# Patient Record
Sex: Female | Born: 1960 | Race: Black or African American | Hispanic: No | Marital: Married
Health system: Southern US, Community
[De-identification: ages and names within clinical notes are randomized; demographics above are authoritative.]

---

## 2016-06-23 ENCOUNTER — Inpatient Hospital Stay
Admission: RE | Admit: 2016-06-23 | Discharge: 2016-08-05 | Disposition: E | Payer: Self-pay | Attending: Internal Medicine | Admitting: Internal Medicine

## 2016-06-23 ENCOUNTER — Other Ambulatory Visit (HOSPITAL_COMMUNITY): Payer: Self-pay

## 2016-06-23 DIAGNOSIS — J9690 Respiratory failure, unspecified, unspecified whether with hypoxia or hypercapnia: Secondary | ICD-10-CM

## 2016-06-23 DIAGNOSIS — Z931 Gastrostomy status: Secondary | ICD-10-CM

## 2016-06-23 DIAGNOSIS — Z992 Dependence on renal dialysis: Secondary | ICD-10-CM

## 2016-06-23 LAB — BLOOD GAS, ARTERIAL
Acid-base deficit: 1.7 mmol/L (ref 0.0–2.0)
Bicarbonate: 22.4 mmol/L (ref 20.0–28.0)
DRAWN BY: 290171
FIO2: 40
O2 Saturation: 98.7 %
PEEP: 5 cmH2O
PO2 ART: 145 mmHg — AB (ref 83.0–108.0)
Patient temperature: 98.6
Pressure support: 10 cmH2O
pCO2 arterial: 36.5 mmHg (ref 32.0–48.0)
pH, Arterial: 7.404 (ref 7.350–7.450)

## 2016-06-23 MED ORDER — IOPAMIDOL (ISOVUE-300) INJECTION 61%
INTRAVENOUS | Status: AC
  Administered 2016-06-23: 20 mL via GASTROSTOMY
  Filled 2016-06-23: qty 50

## 2016-06-24 ENCOUNTER — Other Ambulatory Visit (HOSPITAL_COMMUNITY): Payer: Self-pay

## 2016-06-24 LAB — CBC WITH DIFFERENTIAL/PLATELET
BASOS ABS: 0.1 10*3/uL (ref 0.0–0.1)
BASOS PCT: 0 %
EOS PCT: 2 %
Eosinophils Absolute: 0.2 10*3/uL (ref 0.0–0.7)
HCT: 22.6 % — ABNORMAL LOW (ref 36.0–46.0)
Hemoglobin: 7.4 g/dL — ABNORMAL LOW (ref 12.0–15.0)
Lymphocytes Relative: 11 %
Lymphs Abs: 1.7 10*3/uL (ref 0.7–4.0)
MCH: 29.1 pg (ref 26.0–34.0)
MCHC: 32.7 g/dL (ref 30.0–36.0)
MCV: 89 fL (ref 78.0–100.0)
MONO ABS: 1.2 10*3/uL — AB (ref 0.1–1.0)
MONOS PCT: 8 %
Neutro Abs: 11.8 10*3/uL — ABNORMAL HIGH (ref 1.7–7.7)
Neutrophils Relative %: 79 %
PLATELETS: 475 10*3/uL — AB (ref 150–400)
RBC: 2.54 MIL/uL — ABNORMAL LOW (ref 3.87–5.11)
RDW: 14.7 % (ref 11.5–15.5)
WBC: 15 10*3/uL — ABNORMAL HIGH (ref 4.0–10.5)

## 2016-06-24 LAB — BASIC METABOLIC PANEL
Anion gap: 20 — ABNORMAL HIGH (ref 5–15)
BUN: 82 mg/dL — AB (ref 6–20)
CALCIUM: 9.1 mg/dL (ref 8.9–10.3)
CO2: 22 mmol/L (ref 22–32)
CREATININE: 6.25 mg/dL — AB (ref 0.44–1.00)
Chloride: 95 mmol/L — ABNORMAL LOW (ref 101–111)
GFR calc Af Amer: 8 mL/min — ABNORMAL LOW (ref >60)
GFR, EST NON AFRICAN AMERICAN: 7 mL/min — AB (ref >60)
GLUCOSE: 108 mg/dL — AB (ref 65–99)
Potassium: 5.2 mmol/L — ABNORMAL HIGH (ref 3.5–5.1)
Sodium: 137 mmol/L (ref 135–145)

## 2016-06-25 ENCOUNTER — Other Ambulatory Visit (HOSPITAL_COMMUNITY): Payer: Self-pay

## 2016-06-26 LAB — BASIC METABOLIC PANEL
ANION GAP: 19 — AB (ref 5–15)
ANION GAP: 21 — AB (ref 5–15)
BUN: 130 mg/dL — ABNORMAL HIGH (ref 6–20)
BUN: 134 mg/dL — AB (ref 6–20)
CHLORIDE: 97 mmol/L — AB (ref 101–111)
CO2: 19 mmol/L — ABNORMAL LOW (ref 22–32)
CO2: 16 mmol/L — ABNORMAL LOW (ref 22–32)
Calcium: 8.6 mg/dL — ABNORMAL LOW (ref 8.9–10.3)
Calcium: 9.5 mg/dL (ref 8.9–10.3)
Chloride: 100 mmol/L — ABNORMAL LOW (ref 101–111)
Creatinine, Ser: 8.38 mg/dL — ABNORMAL HIGH (ref 0.44–1.00)
Creatinine, Ser: 8.66 mg/dL — ABNORMAL HIGH (ref 0.44–1.00)
GFR calc non Af Amer: 5 mL/min — ABNORMAL LOW (ref >60)
GFR, EST AFRICAN AMERICAN: 5 mL/min — AB (ref >60)
GFR, EST AFRICAN AMERICAN: 5 mL/min — AB (ref >60)
GFR, EST NON AFRICAN AMERICAN: 5 mL/min — AB (ref >60)
Glucose, Bld: 102 mg/dL — ABNORMAL HIGH (ref 65–99)
Glucose, Bld: 176 mg/dL — ABNORMAL HIGH (ref 65–99)
POTASSIUM: 7.2 mmol/L — AB (ref 3.5–5.1)
POTASSIUM: 6.5 mmol/L — AB (ref 3.5–5.1)
SODIUM: 135 mmol/L (ref 135–145)
SODIUM: 137 mmol/L (ref 135–145)

## 2016-06-27 NOTE — Consult Note (Signed)
Date: 06/27/2016                  Patient Name:  Vicki Reyes  MRN: [id number]  DOB: 1960/07/11  Age / Sex: 56 y.o., female         PCP: No PCP Per Patient                 Service Requesting Consult: hOSPITALIST AT [facility]ELECT SPECIALITY                 Reason for Consult: ARF            History of Present Illness: Patient is a 56 y.o. female who was admitted to [facility] from [facility] on 22-Jul-2016.   Patient has nonischemic cardiomyopathy with EF of less than 25%, hypertension, tobacco use. She presented to [facility] emergency room on April 2 with cardiac arrest. She suffered a PEA arrest in a gas station parking lot and received CPR and ACLS. Her hospital course was complicated by another cardiac arrest, respiratory failure requiring ventilator and subsequent tracheostomy placement, cardiogenic shock status post Impella cardiac pump placement, complete heart block, anoxic brain injury after cardiac arrest with the CT showing diffuse effacement of cerebral sulci and diffuse cerebral edema, acute kidney injury secondary to ischemic ATN in the setting of multiple cardiac arrest and cardiogenic shock. Patient was initially given CRRT support, then transition to intermittent hemodialysis. Iv Contrast exposure with CT 06/09/2016 Hospital course also complicated by Klebsiella bacteremia treated with broad-spectrum IV antibiotics,    Medications: Outpatient medications: No prescriptions prior to admission.    Current medications: No current facility-administered medications for this encounter.     Ciprofloxacin 400 mg IV daily Famotidine 20 twice a day Condition ON Heparin subcutaneous Ipratropium albuterol Levetira cetam  BID Renvela 1600 mg TID   Allergies: Allergies not on file  NKDA  Past Medical History: No past medical history on file.   Past Surgical History: No past surgical history on file.   Family History: No family history on  file.   Social History: Social History   Social History  . Marital status: Married    Spouse name: N/A  . Number of children: N/A  . Years of education: N/A   Occupational History  . Not on file.   Social History Main Topics  . Smoking status: Not on file  . Smokeless tobacco: Not on file  . Alcohol use Not on file  . Drug use: Unknown  . Sexual activity: Not on file   Other Topics Concern  . Not on file   Social History Narrative  . No narrative on file     Review of Systems: patient not able to participate Gen:  HEENT:  CV:  Resp:  GI: GU :  MS:  Derm:   Psych: Heme:  Neuro:  Endocrine  Vital Signs:  Temperature 99.3, pulse 92, respirations 20, blood pressure 137/68  Weight trends: There were no vitals filed for this visit.  Physical Exam: General:  obese lady, chronically ill appearing, laying in the bed  HEENT Eyes are open  Neck:  tracheostomy in place  Lungs: Coarse breath sounds bilaterally, TCT  Heart::  regular, prominent S3 gallop  Abdomen: Soft, PEG tube in place, distended  Extremities:  1-2+ dependent pitting edema  Neurologic: Eyes open, patient is not following commands, does not track   Skin: No acute rashes  Access: Left IJ temp cath  Lab results: Basic Metabolic Panel:  Recent Labs Lab 06/24/16 0537 06/26/16 0600 06/26/16 1029  NA 137 135 137  K 5.2* 7.2* 6.5*  CL 95* 97* 100*  CO2 22 19* 16*  GLUCOSE 108* 102* 176*  BUN 82* 130* 134*  CREATININE 6.25* 8.38* 8.66*  CALCIUM 9.1 8.6* 9.5    Liver Function Tests: No results for input(s): AST, ALT, ALKPHOS, BILITOT, PROT, ALBUMIN in the last 168 hours. No results for input(s): LIPASE, AMYLASE in the last 168 hours. No results for input(s): AMMONIA in the last 168 hours.  CBC:  Recent Labs Lab 06/24/16 0537  WBC 15.0*  NEUTROABS 11.8*  HGB 7.4*  HCT 22.6*  MCV 89.0  PLT 475*    Cardiac Enzymes: No results for input(s): CKTOTAL, TROPONINI in  the last 168 hours.  BNP: Invalid input(s): POCBNP  CBG: No results for input(s): GLUCAP in the last 168 hours.  Microbiology: No results found for this or any previous visit (from the past 720 hour(s)).   Coagulation Studies: No results for input(s): LABPROT, INR in the last 72 hours.  Urinalysis: No results for input(s): COLORURINE, LABSPEC, PHURINE, GLUCOSEU, HGBUR, BILIRUBINUR, KETONESUR, PROTEINUR, UROBILINOGEN, NITRITE, LEUKOCYTESUR in the last 72 hours.  Invalid input(s): APPERANCEUR      Imaging:  No results found.   Assessment & Plan: Pt is a 56 y.o. African American female  was admitted on 2016/07/16 . Patient has Nonischemic cardiomyopathy with EF less than 25%, hypertension, tobacco use, recent cardiac contrast, anoxic brain injury, cerebral edema, acute renal failure, and Klebsiella bacteremia  1. Acute renal failure, ATN 2. Cardiomyopathy with EF less than 25% 3. Anasarca 4. Hyperkalemia  Patient was started on hemodialysis at [facility] for acute renal failure. Patient's kidney function remains critically low the BUN 134 and creatinine is 8.66. As such, patient will remain dialysis dependent in the near future  Plan: Hemodialysis Tuesday, Thursday, Saturday schedule.Most recent dialysis done emergently on Sunday/April 22 Monitor phosphorus during hospital stay. Currently on sevelamer at 1600 3 times a day. Low phosphorus tube feeds hemoglobin 7.4. Check iron studies  And consider epo

## 2016-06-28 LAB — RENAL FUNCTION PANEL
ANION GAP: 17 — AB (ref 5–15)
Albumin: 2.1 g/dL — ABNORMAL LOW (ref 3.5–5.0)
BUN: 87 mg/dL — ABNORMAL HIGH (ref 6–20)
CHLORIDE: 96 mmol/L — AB (ref 101–111)
CO2: 26 mmol/L (ref 22–32)
CREATININE: 6.03 mg/dL — AB (ref 0.44–1.00)
Calcium: 8.3 mg/dL — ABNORMAL LOW (ref 8.9–10.3)
GFR calc non Af Amer: 7 mL/min — ABNORMAL LOW (ref >60)
GFR, EST AFRICAN AMERICAN: 8 mL/min — AB (ref >60)
Glucose, Bld: 113 mg/dL — ABNORMAL HIGH (ref 65–99)
Phosphorus: 8.8 mg/dL — ABNORMAL HIGH (ref 2.5–4.6)
Potassium: 2.5 mmol/L — CL (ref 3.5–5.1)
Sodium: 139 mmol/L (ref 135–145)

## 2016-06-28 LAB — CBC
HCT: 20.9 % — ABNORMAL LOW (ref 36.0–46.0)
Hemoglobin: 6.7 g/dL — CL (ref 12.0–15.0)
MCH: 28.4 pg (ref 26.0–34.0)
MCHC: 32.1 g/dL (ref 30.0–36.0)
MCV: 88.6 fL (ref 78.0–100.0)
PLATELETS: 420 10*3/uL — AB (ref 150–400)
RBC: 2.36 MIL/uL — AB (ref 3.87–5.11)
RDW: 14.8 % (ref 11.5–15.5)
WBC: 15.5 10*3/uL — ABNORMAL HIGH (ref 4.0–10.5)

## 2016-06-28 LAB — FERRITIN: FERRITIN: 598 ng/mL — AB (ref 11–307)

## 2016-06-28 LAB — IRON AND TIBC
Iron: 18 ug/dL — ABNORMAL LOW (ref 28–170)
SATURATION RATIOS: 8 % — AB (ref 10.4–31.8)
TIBC: 234 ug/dL — AB (ref 250–450)
UIBC: 216 ug/dL

## 2016-06-28 LAB — HEPATITIS B CORE ANTIBODY, TOTAL: Hep B Core Total Ab: NEGATIVE

## 2016-06-28 LAB — PREPARE RBC (CROSSMATCH)

## 2016-06-28 LAB — HEPATITIS B SURFACE ANTIGEN: HEP B S AG: NEGATIVE

## 2016-06-28 LAB — POTASSIUM: Potassium: 2.7 mmol/L — CL (ref 3.5–5.1)

## 2016-06-28 LAB — ABO/RH: ABO/RH(D): A POS

## 2016-06-28 LAB — TRANSFERRIN: Transferrin: 167 mg/dL — ABNORMAL LOW (ref 192–382)

## 2016-06-28 LAB — HEPATITIS B SURFACE ANTIBODY, QUANTITATIVE: HEPATITIS B-POST: 33.7 m[IU]/mL

## 2016-06-29 LAB — POTASSIUM: Potassium: 2.9 mmol/L — ABNORMAL LOW (ref 3.5–5.1)

## 2016-06-29 LAB — PTH, INTACT AND CALCIUM
Calcium, Total (PTH): 8.3 mg/dL — ABNORMAL LOW (ref 8.7–10.2)
PTH: 182 pg/mL — ABNORMAL HIGH (ref 15–65)

## 2016-06-29 NOTE — Progress Notes (Signed)
Subjective:   Patient underwent dialysis yesterday. Tolerated well. 2400 cc of fluid was removed however, during treatment, dialyzer clotted and had to be replaced.  Objective:  Vital signs in last 24 hours:   Temperature 90.9 point, pulse 76, respirations 20, blood pressure 97/51  Intake/Output:    Physical Exam: General: No acute distress, laying in the bed  H/ENT: Eyes are closed      Neck: Trach collar in place  Lungs:  Decreased sounds in bases, normal effort  Heart: Regular, prominent S3 gallop  Abdomen:   soft, PEG tube in place, distended  Extremities:  1-2+ dependent pitting edema  Neurologic: Somnolent, not following commands and did not respond to voice   Skin: No acute rashes  Access: Left IJ Temp dialysis cathter    Basic Metabolic Panel:  Recent Labs Lab 06/24/16 0537 06/26/16 0600 06/26/16 1029 06/28/16 0634 06/28/16 0635 06/28/16 0800 06/29/16 0606  NA 137 135 137  --  139  --   --   K 5.2* 7.2* 6.5*  --  2.5* 2.7* 2.9*  CL 95* 97* 100*  --  96*  --   --   CO2 22 19* 16*  --  26  --   --   GLUCOSE 108* 102* 176*  --  113*  --   --   BUN 82* 130* 134*  --  87*  --   --   CREATININE 6.25* 8.38* 8.66*  --  6.03*  --   --   CALCIUM 9.1 8.6* 9.5 8.3* 8.3*  --   --   PHOS  --   --   --   --  8.8*  --   --    Lab Results  Component Value Date   PTH 182 (H) 06/28/2016   PTH Comment 06/28/2016   CALCIUM 8.3 (L) 06/28/2016   PHOS 8.8 (H) 06/28/2016    CBC:  Recent Labs Lab 06/24/16 0537 06/28/16 0634  WBC 15.0* 15.5*  NEUTROABS 11.8*  --   HGB 7.4* 6.7*  HCT 22.6* 20.9*  MCV 89.0 88.6  PLT 475* 420*     Microbiology:  No results found for this or any previous visit (from the past 240 hour(s)).   Lab Results  Component Value Date   HEPBSAG Negative 06/27/2016    Imaging: No results found.   Medications:  Cipro 400 mg IV daily Famotidine 20 mg twice a day Fish oil 6000 mg daily 5000 units of heparin subcutaneous every 8  hours Ipratropium albuterol Prostat renvela 1600 mg TID  Assessment/ Plan:  56 y.o. female was admitted on 07/04/16 . Patient has Nonischemic cardiomyopathy with EF less than 25%, hypertension, tobacco use, recent cardiac contrast, anoxic brain injury, cerebral edema, acute renal failure, and Klebsiella bacteremia  1. Acute renal failure, ATN 2. Cardiomyopathy with EF less than 25% (Chronic systolic CHF) 3. Anasarca 4. Hyperkalemia, now hypokalemia 5. Iron deficiency anemia  Patient was started on hemodialysis at [facility] for acute renal failure. Patient's kidney function remains critically low  BUN 87 and creatinine is 6. As such, patient will remain dialysis dependent in the near future  Plan: Hemodialysis Tuesday, Thursday, Saturday schedule.  2400 cc fluid removed with last HD treatment Monitor phosphorus during hospital stay. Currently on sevelamer at 1600 3 times a day. Low phosphorus tube feeds hemoglobin 6.7. iron studies show iron deficiency. With recent bacteremia, avoid iv iron as long as getting iv ABx Start oral iron therapy (via g tube)  consider blood transfusion for Hgb < 7 Heparin iv with dialysis to avoid blood loss from clotted dialyzers Kcl supplements with g tube   LOS: 0 Kenden Brandt 4/25/20184:49 PM

## 2016-06-30 LAB — RENAL FUNCTION PANEL
Albumin: 2.4 g/dL — ABNORMAL LOW (ref 3.5–5.0)
Anion gap: 16 — ABNORMAL HIGH (ref 5–15)
BUN: 88 mg/dL — AB (ref 6–20)
CALCIUM: 8.9 mg/dL (ref 8.9–10.3)
CO2: 26 mmol/L (ref 22–32)
CREATININE: 4.89 mg/dL — AB (ref 0.44–1.00)
Chloride: 96 mmol/L — ABNORMAL LOW (ref 101–111)
GFR calc Af Amer: 10 mL/min — ABNORMAL LOW (ref >60)
GFR, EST NON AFRICAN AMERICAN: 9 mL/min — AB (ref >60)
Glucose, Bld: 107 mg/dL — ABNORMAL HIGH (ref 65–99)
POTASSIUM: 3.9 mmol/L (ref 3.5–5.1)
Phosphorus: 6.9 mg/dL — ABNORMAL HIGH (ref 2.5–4.6)
SODIUM: 138 mmol/L (ref 135–145)

## 2016-06-30 LAB — CBC
HEMATOCRIT: 25.5 % — AB (ref 36.0–46.0)
Hemoglobin: 8.1 g/dL — ABNORMAL LOW (ref 12.0–15.0)
MCH: 29 pg (ref 26.0–34.0)
MCHC: 31.8 g/dL (ref 30.0–36.0)
MCV: 91.4 fL (ref 78.0–100.0)
PLATELETS: 346 10*3/uL (ref 150–400)
RBC: 2.79 MIL/uL — ABNORMAL LOW (ref 3.87–5.11)
RDW: 14.6 % (ref 11.5–15.5)
WBC: 16.8 10*3/uL — ABNORMAL HIGH (ref 4.0–10.5)

## 2016-07-01 NOTE — Progress Notes (Signed)
Subjective:   Patient underwent dialysis yesterday. Tolerated well. 2500 cc of fluid was removed  Not following commands/responding to verbal or tactile stimuli  Objective:  Vital signs in last 24 hours:   Temperature 99, pulse 97, respirations 23, blood pressure 138/66  Intake/Output:    Physical Exam: General: No acute distress, laying in the bed  H/ENT: Eyes are open      Neck: Trach collar in place  Lungs:  Decreased sounds in bases, normal effort  Heart: Regular, prominent S3 gallop  Abdomen:   soft, PEG tube in place, distended  Extremities:  1-2+ dependent pitting edema  Neurologic: Somnolent, not following commands and did not respond to voice   Skin: No acute rashes  Access: Left IJ Temp dialysis cathter    Basic Metabolic Panel:  Recent Labs Lab 06/26/16 0600 06/26/16 1029 06/28/16 0634 06/28/16 0635 06/28/16 0800 06/29/16 0606 06/30/16 0719  NA 135 137  --  139  --   --  138  K 7.2* 6.5*  --  2.5* 2.7* 2.9* 3.9  CL 97* 100*  --  96*  --   --  96*  CO2 19* 16*  --  26  --   --  26  GLUCOSE 102* 176*  --  113*  --   --  107*  BUN 130* 134*  --  87*  --   --  88*  CREATININE 8.38* 8.66*  --  6.03*  --   --  4.89*  CALCIUM 8.6* 9.5 8.3* 8.3*  --   --  8.9  PHOS  --   --   --  8.8*  --   --  6.9*   Lab Results  Component Value Date   PTH 182 (H) 06/28/2016   PTH Comment 06/28/2016   CALCIUM 8.9 06/30/2016   PHOS 6.9 (H) 06/30/2016    CBC:  Recent Labs Lab 06/28/16 0634 06/30/16 0719  WBC 15.5* 16.8*  HGB 6.7* 8.1*  HCT 20.9* 25.5*  MCV 88.6 91.4  PLT 420* 346     Microbiology:  No results found for this or any previous visit (from the past 240 hour(s)).    Lab Results  Component Value Date   HEPBSAG Negative 06/27/2016    Imaging: No results found.   Medications:  Cipro 400 mg IV daily Famotidine 20 mg twice a day Fish oil 6000 mg daily 5000 units of heparin subcutaneous every 8 hours Ipratropium  albuterol Prostat renvela 1600 mg TID  Assessment/ Plan:  56 y.o. female was admitted on 06/17/2016 . Patient has Nonischemic cardiomyopathy with EF less than 25%, hypertension, tobacco use, recent cardiac contrast, anoxic brain injury, cerebral edema, acute renal failure, and Klebsiella bacteremia  1. Acute renal failure, ATN 2. Cardiomyopathy with EF less than 25% (Chronic systolic CHF) 3. Anasarca 4. Hyperkalemia, now hypokalemia 5. Iron deficiency anemia  Patient was started on hemodialysis at [facility] for acute renal failure. Patient's kidney function remains critically low   patient Remains dialysis dependent in the near future  Plan: Hemodialysis Tuesday, Thursday, Saturday schedule.  2400 cc fluid removed with last HD treatment Monitor phosphorus during hospital stay. Currently on sevelamer at 1600 3 times a day. Low phosphorus tube feeds hemoglobin 8.1. iron studies show iron deficiency. With recent bacteremia, avoid iv iron as long as getting iv ABx Start oral iron therapy (via g tube) consider blood transfusion for Hgb < 7 Heparin iv with dialysis to avoid blood loss from clotted dialyzers  Kcl supplements with g tube   LOS: 0 Vicki Reyes 4/27/20189:48 AM

## 2016-07-02 LAB — CBC
HEMATOCRIT: 28.8 % — AB (ref 36.0–46.0)
HEMOGLOBIN: 9.3 g/dL — AB (ref 12.0–15.0)
MCH: 29.3 pg (ref 26.0–34.0)
MCHC: 32.3 g/dL (ref 30.0–36.0)
MCV: 90.9 fL (ref 78.0–100.0)
Platelets: 425 10*3/uL — ABNORMAL HIGH (ref 150–400)
RBC: 3.17 MIL/uL — AB (ref 3.87–5.11)
RDW: 14.2 % (ref 11.5–15.5)
WBC: 22 10*3/uL — ABNORMAL HIGH (ref 4.0–10.5)

## 2016-07-02 LAB — RENAL FUNCTION PANEL
Albumin: 2.9 g/dL — ABNORMAL LOW (ref 3.5–5.0)
Anion gap: 16 — ABNORMAL HIGH (ref 5–15)
BUN: 107 mg/dL — ABNORMAL HIGH (ref 6–20)
CHLORIDE: 97 mmol/L — AB (ref 101–111)
CO2: 25 mmol/L (ref 22–32)
Calcium: 9.7 mg/dL (ref 8.9–10.3)
Creatinine, Ser: 4.51 mg/dL — ABNORMAL HIGH (ref 0.44–1.00)
GFR, EST AFRICAN AMERICAN: 12 mL/min — AB (ref >60)
GFR, EST NON AFRICAN AMERICAN: 10 mL/min — AB (ref >60)
Glucose, Bld: 129 mg/dL — ABNORMAL HIGH (ref 65–99)
POTASSIUM: 4.1 mmol/L (ref 3.5–5.1)
Phosphorus: 6.9 mg/dL — ABNORMAL HIGH (ref 2.5–4.6)
Sodium: 138 mmol/L (ref 135–145)

## 2016-07-02 LAB — TYPE AND SCREEN
ABO/RH(D): A POS
Antibody Screen: NEGATIVE
Unit division: 0
Unit division: 0

## 2016-07-02 LAB — BPAM RBC
Blood Product Expiration Date: 201804302359
Blood Product Expiration Date: 201805042359
ISSUE DATE / TIME: 201804161049
ISSUE DATE / TIME: 201804240920
Unit Type and Rh: 600
Unit Type and Rh: 6200

## 2016-07-03 LAB — URINALYSIS, ROUTINE W REFLEX MICROSCOPIC
Bilirubin Urine: NEGATIVE
GLUCOSE, UA: NEGATIVE mg/dL
Ketones, ur: NEGATIVE mg/dL
Nitrite: NEGATIVE
PH: 6 (ref 5.0–8.0)
PROTEIN: 100 mg/dL — AB
Specific Gravity, Urine: 1.011 (ref 1.005–1.030)

## 2016-07-04 ENCOUNTER — Other Ambulatory Visit (HOSPITAL_COMMUNITY): Payer: Self-pay

## 2016-07-04 NOTE — Progress Notes (Signed)
Subjective:  Patient on a Tuesday, Thursday, Saturday dialysis schedule. She will be due for diagnosis again tomorrow. Resting comfortably in bed at home. Not interacting at all.  Objective:  Vital signs in last 24 hours:  Pulse 86 respirations 19 blood pressure 100/60/114 pulse ox 100%   Physical Exam: General: No acute distress, laying in the bed  HENT: [place]/AT, OM moist  Eyes: Eyes closed  Neck: Trach collar in place  Lungs:  Scattered rhonchi, normal effort  Heart: S1S2 no rubs  Abdomen:  soft, PEG tube in place, distended  Extremities: 1-2+ dependent pitting edema  Neurologic: Did not arouse to voice, not following commands  Skin: No acute rashes  Access: Left IJ Temp dialysis cathter    Basic Metabolic Panel:  Recent Labs Lab 06/28/16 0635 06/28/16 0800 06/29/16 0606 06/30/16 0719 07/02/16 0500  NA 139  --   --  138 138  K 2.5* 2.7* 2.9* 3.9 4.1  CL 96*  --   --  96* 97*  CO2 26  --   --  26 25  GLUCOSE 113*  --   --  107* 129*  BUN 87*  --   --  88* 107*  CREATININE 6.03*  --   --  4.89* 4.51*  CALCIUM 8.3*  --   --  8.9 9.7  PHOS 8.8*  --   --  6.9* 6.9*   Lab Results  Component Value Date   PTH 182 (H) 06/28/2016   PTH Comment 06/28/2016   CALCIUM 9.7 07/02/2016   PHOS 6.9 (H) 07/02/2016    CBC:  Recent Labs Lab 06/28/16 0634 06/30/16 0719 07/02/16 0500  WBC 15.5* 16.8* 22.0*  HGB 6.7* 8.1* 9.3*  HCT 20.9* 25.5* 28.8*  MCV 88.6 91.4 90.9  PLT 420* 346 425*     Microbiology:  No results found for this or any previous visit (from the past 240 hour(s)).    Lab Results  Component Value Date   HEPBSAG Negative 06/27/2016    Imaging: Dg Chest Port 1 View  Result Date: 07/04/2016 CLINICAL DATA:  56 year old female with respiratory failure EXAM: PORTABLE CHEST 1 VIEW COMPARISON:  Prior chest x-ray 04/31/2018 FINDINGS: Tracheostomy tube is midline and at the level of the clavicles. Left IJ approach non tunneled hemodialysis catheter in  good position with the tip at the superior cavoatrial junction. The right upper extremity PICC has been removed compared to the prior chest x-ray. Stable cardiomegaly with left heart enlargement. Mild pulmonary vascular congestion without overt edema. Likely small bilateral pleural effusions and associated basilar atelectasis. The left diaphragm is obscured. No acute osseous abnormality. IMPRESSION: 1. Interval removal of the right upper extremity PICC. Otherwise, stable support apparatus. 2. Left basilar opacity obscures the diaphragm. This is favored to represent a combination of pleural fluid and atelectasis, however infiltrate/pneumonia is difficult to exclude radiographically. 3. Stable cardiomegaly with left heart enlargement. 4. Mild pulmonary vascular congestion without overt edema. Electronically Signed   By: Malachy Moan M.D.   On: 07/04/2016 07:32     Medications:  renvela 1600 mg TID  Assessment/ Plan:  56 y.o. female was admitted on 06/05/2016 . Patient has Nonischemic cardiomyopathy with EF less than 25%, hypertension, tobacco use, recent cardiac contrast, anoxic brain injury, cerebral edema, acute renal failure, and Klebsiella bacteremia  1. Acute renal failure, ATN 2. Cardiomyopathy with EF less than 25% (Chronic systolic CHF) 3. Anasarca 4. Hyperkalemia, now hypokalemia 5. Iron deficiency anemia/anemia of CKD.  6. Secondary hyperparathyroidism.  Patient was started on hemodialysis at [facility] for acute renal failure. Patient's kidney function remains critically low and she remains dialysis dependent.  patient Remains dialysis dependent in the near future  Plan: atient due for hemodialysis on Tuesday. BUN currently 107 with a creatinine of 4.51.  We will continue to perform dialysis on Tuesday, Thursday, and Saturday schedule.Continue to monitor hemoglobinperiodically. Also plan to check phosphorus with the next dose is treatment. Continue renvela  otherwise. Overall prognosis quite guarded.   LOS: 0 Lekesha Claw 4/30/20183:13 PM

## 2016-07-05 LAB — RENAL FUNCTION PANEL
Albumin: 3.1 g/dL — ABNORMAL LOW (ref 3.5–5.0)
Anion gap: 17 — ABNORMAL HIGH (ref 5–15)
BUN: 145 mg/dL — AB (ref 6–20)
CHLORIDE: 101 mmol/L (ref 101–111)
CO2: 23 mmol/L (ref 22–32)
Calcium: 10.2 mg/dL (ref 8.9–10.3)
Creatinine, Ser: 4.99 mg/dL — ABNORMAL HIGH (ref 0.44–1.00)
GFR calc Af Amer: 10 mL/min — ABNORMAL LOW (ref >60)
GFR calc non Af Amer: 9 mL/min — ABNORMAL LOW (ref >60)
Glucose, Bld: 152 mg/dL — ABNORMAL HIGH (ref 65–99)
POTASSIUM: 4.1 mmol/L (ref 3.5–5.1)
Phosphorus: 7.2 mg/dL — ABNORMAL HIGH (ref 2.5–4.6)
Sodium: 141 mmol/L (ref 135–145)

## 2016-07-05 LAB — URINE CULTURE: Culture: NO GROWTH

## 2016-07-05 LAB — CBC
HCT: 29.7 % — ABNORMAL LOW (ref 36.0–46.0)
Hemoglobin: 9.6 g/dL — ABNORMAL LOW (ref 12.0–15.0)
MCH: 29.2 pg (ref 26.0–34.0)
MCHC: 32.3 g/dL (ref 30.0–36.0)
MCV: 90.3 fL (ref 78.0–100.0)
PLATELETS: 459 10*3/uL — AB (ref 150–400)
RBC: 3.29 MIL/uL — ABNORMAL LOW (ref 3.87–5.11)
RDW: 14.2 % (ref 11.5–15.5)
WBC: 16.4 10*3/uL — ABNORMAL HIGH (ref 4.0–10.5)

## 2016-07-05 DEATH — deceased

## 2016-07-06 LAB — PTH, INTACT AND CALCIUM
Calcium, Total (PTH): 10.2 mg/dL (ref 8.7–10.2)
PTH: 101 pg/mL — AB (ref 15–65)

## 2016-07-06 NOTE — Progress Notes (Signed)
Subjective:  Patient due for hemodialysis again today. Not responding to commands at this time.   Objective:  Vital signs in last 24 hours:  Temperature 97.3 pulse 100 respirations 29 blood pressure 142/76   Physical Exam: General: No acute distress, laying in the bed  HENT: [place]/AT, OM moist  Eyes: Eyes closed  Neck: Trach collar in place  Lungs:  Scattered rhonchi, normal effort  Heart: S1S2 no rubs  Abdomen:  soft, PEG tube in place, distended  Extremities: 1-2+ dependent pitting edema  Neurologic: Not following commands  Skin: No acute rashes  Access: Left IJ Temp dialysis cathter    Basic Metabolic Panel:  Recent Labs Lab 06/30/16 0719 07/02/16 0500 07/05/16 0800  NA 138 138 141  K 3.9 4.1 4.1  CL 96* 97* 101  CO2 GLUCOSE 107* 129* 152*  BUN 88* 107* 145*  CREATININE 4.89* 4.51* 4.99*  CALCIUM 8.9 9.7 10.2  PHOS 6.9* 6.9* 7.2*   Lab Results  Component Value Date   PTH 182 (H) 06/28/2016   PTH Comment 06/28/2016   CALCIUM 10.2 07/05/2016   PHOS 7.2 (H) 07/05/2016    CBC:  Recent Labs Lab 06/30/16 0719 07/02/16 0500 07/05/16 0800  WBC 16.8* 22.0* 16.4*  HGB 8.1* 9.3* 9.6*  HCT 25.5* 28.8* 29.7*  MCV 91.4 90.9 90.3  PLT 346 425* 459*     Microbiology:  Recent Results (from the past 240 hour(s))  Culture, Urine     Status: None   Collection Time: 07/03/16  5:30 PM  Result Value Ref Range Status   Specimen Description URINE, RANDOM  Final   Special Requests NONE  Final   Culture NO GROWTH  Final   Report Status 07/05/2016 FINAL  Final      Lab Results  Component Value Date   HEPBSAG Negative 06/27/2016    Imaging: Dg Chest Port 1 View  Result Date: 07/04/2016 CLINICAL DATA:  56 year old female with respiratory failure EXAM: PORTABLE CHEST 1 VIEW COMPARISON:  Prior chest x-ray 04/31/2018 FINDINGS: Tracheostomy tube is midline and at the level of the clavicles. Left IJ approach non tunneled hemodialysis catheter in good  position with the tip at the superior cavoatrial junction. The right upper extremity PICC has been removed compared to the prior chest x-ray. Stable cardiomegaly with left heart enlargement. Mild pulmonary vascular congestion without overt edema. Likely small bilateral pleural effusions and associated basilar atelectasis. The left diaphragm is obscured. No acute osseous abnormality. IMPRESSION: 1. Interval removal of the right upper extremity PICC. Otherwise, stable support apparatus. 2. Left basilar opacity obscures the diaphragm. This is favored to represent a combination of pleural fluid and atelectasis, however infiltrate/pneumonia is difficult to exclude radiographically. 3. Stable cardiomegaly with left heart enlargement. 4. Mild pulmonary vascular congestion without overt edema. Electronically Signed   By: Malachy Moan M.D.   On: 07/04/2016 07:32     Medications:  renvela 1600 mg TID  Assessment/ Plan:  56 y.o. female was admitted on 07/03/2016 . Patient has Nonischemic cardiomyopathy with EF less than 25%, hypertension, tobacco use, recent cardiac contrast, anoxic brain injury, cerebral edema, acute renal failure, and Klebsiella bacteremia  1. Acute renal failure, ATN 2. Cardiomyopathy with EF less than 25% (Chronic systolic CHF) 3. Anasarca 4. Hyperkalemia, now hypokalemia 5. Iron deficiency anemia/anemia of CKD.  6. Secondary hyperparathyroidism.  Patient was started on hemodialysis at [facility] for acute renal failure. Patient's kidney function remains critically low and she remains dialysis  dependent.   Plan: Patient completed hemodialysis yesterday. We will plan for dialysis again tomorrow. Hemoglobin 9.6 and also target. Continue ultrafiltration with dialysis to treat underlying anasarca.  Phosphorus remains quite high at 7.2.  Start liquid calcium acetate 15 cc 3 times a day. Continue to monitor serum phosphorus.   LOS: 0 Motty Borin 5/2/20186:29  AM

## 2016-07-07 LAB — CBC
HCT: 18.5 % — ABNORMAL LOW (ref 36.0–46.0)
HCT: 28.7 % — ABNORMAL LOW (ref 36.0–46.0)
HEMOGLOBIN: 9.4 g/dL — AB (ref 12.0–15.0)
Hemoglobin: 6.1 g/dL — CL (ref 12.0–15.0)
MCH: 29.6 pg (ref 26.0–34.0)
MCH: 29.5 pg (ref 26.0–34.0)
MCHC: 33 g/dL (ref 30.0–36.0)
MCHC: 32.8 g/dL (ref 30.0–36.0)
MCV: 89.8 fL (ref 78.0–100.0)
MCV: 90 fL (ref 78.0–100.0)
PLATELETS: 481 10*3/uL — AB (ref 150–400)
Platelets: 351 10*3/uL (ref 150–400)
RBC: 2.06 MIL/uL — AB (ref 3.87–5.11)
RBC: 3.19 MIL/uL — AB (ref 3.87–5.11)
RDW: 14.7 % (ref 11.5–15.5)
RDW: 14.5 % (ref 11.5–15.5)
WBC: 19.3 10*3/uL — ABNORMAL HIGH (ref 4.0–10.5)
WBC: 26.7 10*3/uL — ABNORMAL HIGH (ref 4.0–10.5)

## 2016-07-07 LAB — RENAL FUNCTION PANEL
ANION GAP: 20 — AB (ref 5–15)
Albumin: 3 g/dL — ABNORMAL LOW (ref 3.5–5.0)
BUN: 131 mg/dL — ABNORMAL HIGH (ref 6–20)
CHLORIDE: 94 mmol/L — AB (ref 101–111)
CO2: 23 mmol/L (ref 22–32)
CREATININE: 4.44 mg/dL — AB (ref 0.44–1.00)
Calcium: 10.9 mg/dL — ABNORMAL HIGH (ref 8.9–10.3)
GFR calc non Af Amer: 10 mL/min — ABNORMAL LOW (ref >60)
GFR, EST AFRICAN AMERICAN: 12 mL/min — AB (ref >60)
Glucose, Bld: 130 mg/dL — ABNORMAL HIGH (ref 65–99)
Phosphorus: 5.9 mg/dL — ABNORMAL HIGH (ref 2.5–4.6)
Potassium: 3.7 mmol/L (ref 3.5–5.1)
Sodium: 137 mmol/L (ref 135–145)

## 2016-07-08 ENCOUNTER — Other Ambulatory Visit (HOSPITAL_COMMUNITY): Payer: Self-pay

## 2016-07-08 LAB — PROCALCITONIN: Procalcitonin: 5.76 ng/mL

## 2016-07-08 LAB — CULTURE, RESPIRATORY

## 2016-07-08 LAB — CBC
HCT: 30.1 % — ABNORMAL LOW (ref 36.0–46.0)
HEMOGLOBIN: 9.5 g/dL — AB (ref 12.0–15.0)
MCH: 28.7 pg (ref 26.0–34.0)
MCHC: 31.6 g/dL (ref 30.0–36.0)
MCV: 90.9 fL (ref 78.0–100.0)
Platelets: 345 10*3/uL (ref 150–400)
RBC: 3.31 MIL/uL — AB (ref 3.87–5.11)
RDW: 14.3 % (ref 11.5–15.5)
WBC: 23 10*3/uL — ABNORMAL HIGH (ref 4.0–10.5)

## 2016-07-08 LAB — CULTURE, RESPIRATORY W GRAM STAIN: Culture: NORMAL

## 2016-07-08 NOTE — Progress Notes (Signed)
Subjective:   Not responding to commands at this time. Patient underwent hemodialysis treatment yesterday. Tolerated well.  Objective:  Vital signs in last 24 hours:  Temperature 98, pulse 99, respirations 18, blood pressure 151/78   Physical Exam: General: No acute distress, laying in the bed  HENT: [place]/AT, OM moist  Eyes: Eyes closed  Neck: Trach collar in place  Lungs:  Scattered rhonchi, normal effort  Heart: S1S2 no rubs  Abdomen:  soft, PEG tube in place, distended  Extremities: 1-2+ dependent pitting edema  Neurologic: Not following commands  Skin: No acute rashes  Access: Left IJ Temp dialysis cathter    Basic Metabolic Panel:  Recent Labs Lab 07/02/16 0500 07/05/16 0800 07/07/16 0830  NA 138 141 137  K 4.1 4.1 3.7  CL 97* 101 94*  CO2 25 23 23   GLUCOSE 129* 152* 130*  BUN 107* 145* 131*  CREATININE 4.51* 4.99* 4.44*  CALCIUM 9.7 10.2  10.2 10.9*  PHOS 6.9* 7.2* 5.9*   Lab Results  Component Value Date   PTH 101 (H) 07/05/2016   PTH Comment 07/05/2016   CALCIUM 10.9 (H) 07/07/2016   PHOS 5.9 (H) 07/07/2016    CBC:  Recent Labs Lab 07/02/16 0500 07/05/16 0800 07/07/16 0830 07/07/16 1404 07/08/16 0557  WBC 22.0* 16.4* 19.3* 26.7* 23.0*  HGB 9.3* 9.6* 6.1* 9.4* 9.5*  HCT 28.8* 29.7* 18.5* 28.7* 30.1*  MCV 90.9 90.3 89.8 90.0 90.9  PLT 425* 459* 481* 351 345     Microbiology:  Recent Results (from the past 240 hour(s))  Culture, Urine     Status: None   Collection Time: 07/03/16  5:30 PM  Result Value Ref Range Status   Specimen Description URINE, RANDOM  Final   Special Requests NONE  Final   Culture NO GROWTH  Final   Report Status 07/05/2016 FINAL  Final  Culture, respiratory (NON-Expectorated)     Status: None   Collection Time: 07/06/16 10:46 AM  Result Value Ref Range Status   Specimen Description TRACHEAL ASPIRATE  Final   Special Requests NONE  Final   Gram Stain   Final    ABUNDANT WBC PRESENT, PREDOMINANTLY  PMN ABUNDANT GRAM NEGATIVE RODS MODERATE GRAM POSITIVE COCCI FEW GRAM POSITIVE RODS    Culture Consistent with normal respiratory flora.  Final   Report Status 07/08/2016 FINAL  Final  Culture, respiratory (NON-Expectorated)     Status: None (Preliminary result)   Collection Time: 07/07/16  3:07 PM  Result Value Ref Range Status   Specimen Description TRACHEAL ASPIRATE  Final   Special Requests NONE  Final   Gram Stain   Final    RARE WBC PRESENT, PREDOMINANTLY PMN FEW SQUAMOUS EPITHELIAL CELLS PRESENT FEW GRAM NEGATIVE RODS FEW GRAM POSITIVE COCCI IN PAIRS    Culture PENDING  Incomplete   Report Status PENDING  Incomplete      Lab Results  Component Value Date   HEPBSAG Negative 06/27/2016    Imaging: Dg Chest Port 1 View  Result Date: 07/08/2016 CLINICAL DATA:  Respiratory failure EXAM: PORTABLE CHEST 1 VIEW COMPARISON:  July 04, 2016 FINDINGS: Tracheostomy catheter tip is 4.0 cm above the carina. Central catheter tip is in the superior vena cava near the cavoatrial junction. No pneumothorax. There is a left pleural effusion with left base atelectasis. Lungs elsewhere clear. Heart is enlarged with pulmonary vascularity within normal limits. No adenopathy. No bone lesions. IMPRESSION: Stable cardiomegaly. Left pleural effusion with left base atelectasis, stable. No new opacity.  Tube and catheter positions as described without pneumothorax. Electronically Signed   By: Bretta BangWilliam  Woodruff III M.D.   On: 07/08/2016 07:25     Medications:  renvela 1600 mg TID  Assessment/ Plan:  56 y.o. female was admitted on 2017/02/15 . Patient has Nonischemic cardiomyopathy with EF less than 25%, hypertension, tobacco use, recent cardiac contrast, anoxic brain injury, cerebral edema, acute renal failure, and Klebsiella bacteremia  1. Acute renal failure, ATN 2. Cardiomyopathy with EF less than 25% (Chronic systolic CHF) 3. Anasarca 4. Hyperkalemia, now hypokalemia 5. Iron deficiency  anemia/anemia of CKD.  6. Secondary hyperparathyroidism. hypercalcemia  [facility]. Patient's kidney function remains critically low and she remains dialysis dependent.   Plan: Patient completed hemodialysis yesterday. We will plan for dialysis again tomorrow.  Hemoglobin 9.5 , acceptable  Continue ultrafiltration with dialysis to treat underlying anasarca.   Phosphorus was high but now has improved slightly to 5.9. Calcium acetate, however patient has developed hypercalcemia We'll discontinue calcium acetate. Increase the dose of renvela 4 times per day since patient gets continuous tube feeds      LOS: 0 Verlie Liotta 5/4/20183:53 PM

## 2016-07-09 LAB — CBC
HEMATOCRIT: 28.7 % — AB (ref 36.0–46.0)
HEMOGLOBIN: 9.2 g/dL — AB (ref 12.0–15.0)
MCH: 29.2 pg (ref 26.0–34.0)
MCHC: 32.1 g/dL (ref 30.0–36.0)
MCV: 91.1 fL (ref 78.0–100.0)
Platelets: 341 10*3/uL (ref 150–400)
RBC: 3.15 MIL/uL — ABNORMAL LOW (ref 3.87–5.11)
RDW: 14.4 % (ref 11.5–15.5)
WBC: 19.5 10*3/uL — AB (ref 4.0–10.5)

## 2016-07-09 LAB — RENAL FUNCTION PANEL
ALBUMIN: 3.1 g/dL — AB (ref 3.5–5.0)
ANION GAP: 18 — AB (ref 5–15)
BUN: 158 mg/dL — ABNORMAL HIGH (ref 6–20)
CO2: 25 mmol/L (ref 22–32)
Calcium: 11.6 mg/dL — ABNORMAL HIGH (ref 8.9–10.3)
Chloride: 97 mmol/L — ABNORMAL LOW (ref 101–111)
Creatinine, Ser: 4.06 mg/dL — ABNORMAL HIGH (ref 0.44–1.00)
GFR, EST AFRICAN AMERICAN: 13 mL/min — AB (ref >60)
GFR, EST NON AFRICAN AMERICAN: 11 mL/min — AB (ref >60)
Glucose, Bld: 135 mg/dL — ABNORMAL HIGH (ref 65–99)
PHOSPHORUS: 5 mg/dL — AB (ref 2.5–4.6)
POTASSIUM: 4 mmol/L (ref 3.5–5.1)
Sodium: 140 mmol/L (ref 135–145)

## 2016-07-10 LAB — CULTURE, RESPIRATORY W GRAM STAIN

## 2016-07-10 LAB — CULTURE, RESPIRATORY: CULTURE: NORMAL

## 2016-07-11 NOTE — Progress Notes (Signed)
Subjective:  Patient with elevated WBC count over the weekend. Temporary dialysis catheter was removed. Patient resting comfortably in bed at the moment.   Objective:  Vital signs in last 24 hours:  Temperature 97.5 pulse 90 respirations 18 blood pressure 100/32/72   Physical Exam: General: No acute distress, laying in the bed  HENT: /AT, OM moist  Eyes: Eyes closed  Neck: Trach collar in place  Lungs:  Scattered rhonchi, normal effort  Heart: S1S2 no rubs  Abdomen:  soft, PEG tube in place, distended  Extremities: 1+ dependent pitting edema  Neurologic: Not following commands  Skin: No acute rashes  Access: Dialysis catheter removed    Basic Metabolic Panel:  Recent Labs Lab 07/05/16 0800 07/07/16 0830 07/09/16 0500  NA 141 137 140  K 4.1 3.7 4.0  CL 101 94* 97*  CO2 23 23 25   GLUCOSE 152* 130* 135*  BUN 145* 131* 158*  CREATININE 4.99* 4.44* 4.06*  CALCIUM 10.2  10.2 10.9* 11.6*  PHOS 7.2* 5.9* 5.0*   Lab Results  Component Value Date   PTH 101 (H) 07/05/2016   PTH Comment 07/05/2016   CALCIUM 11.6 (H) 07/09/2016   PHOS 5.0 (H) 07/09/2016    CBC:  Recent Labs Lab 07/05/16 0800 07/07/16 0830 07/07/16 1404 07/08/16 0557 07/09/16 0500  WBC 16.4* 19.3* 26.7* 23.0* 19.5*  HGB 9.6* 6.1* 9.4* 9.5* 9.2*  HCT 29.7* 18.5* 28.7* 30.1* 28.7*  MCV 90.3 89.8 90.0 90.9 91.1  PLT 459* 481* 351 345 341     Microbiology:  Recent Results (from the past 240 hour(s))  Culture, Urine     Status: None   Collection Time: 07/03/16  5:30 PM  Result Value Ref Range Status   Specimen Description URINE, RANDOM  Final   Special Requests NONE  Final   Culture NO GROWTH  Final   Report Status 07/05/2016 FINAL  Final  Culture, respiratory (NON-Expectorated)     Status: None   Collection Time: 07/06/16 10:46 AM  Result Value Ref Range Status   Specimen Description TRACHEAL ASPIRATE  Final   Special Requests NONE  Final   Gram Stain   Final    ABUNDANT WBC  PRESENT, PREDOMINANTLY PMN ABUNDANT GRAM NEGATIVE RODS MODERATE GRAM POSITIVE COCCI FEW GRAM POSITIVE RODS    Culture Consistent with normal respiratory flora.  Final   Report Status 07/08/2016 FINAL  Final  Culture, respiratory (NON-Expectorated)     Status: None   Collection Time: 07/07/16  3:07 PM  Result Value Ref Range Status   Specimen Description TRACHEAL ASPIRATE  Final   Special Requests NONE  Final   Gram Stain   Final    RARE WBC PRESENT, PREDOMINANTLY PMN FEW SQUAMOUS EPITHELIAL CELLS PRESENT FEW GRAM NEGATIVE RODS FEW GRAM POSITIVE COCCI IN PAIRS    Culture Consistent with normal respiratory flora.  Final   Report Status 07/10/2016 FINAL  Final      Lab Results  Component Value Date   HEPBSAG Negative 06/27/2016    Imaging: No results found.   Medications:  renvela 1600 mg TID  Assessment/ Plan:  56 y.o. female was admitted on 06/24/2016 . Patient has Nonischemic cardiomyopathy with EF less than 25%, hypertension, tobacco use, recent cardiac contrast, anoxic brain injury, cerebral edema, acute renal failure, and Klebsiella bacteremia  1. Acute renal failure, ATN 2. Cardiomyopathy with EF less than 25% (Chronic systolic CHF) 3. Anasarca 4. Hyperkalemia, now hypokalemia 5. Iron deficiency anemia/anemia of CKD.  6. Secondary hyperparathyroidism. hypercalcemia  Patient was started on hemodialysis at [facility] for acute renal failure. Patient's kidney function remains critically low and she remains dialysis dependent.   Plan: Temporary dialysis catheter was removed over the weekend. Patient had elevated WBC count. No blood cultures data to review at the moment however. Management of leukocytosis as per hospitalist. We will request temporary dialysis catheter to be placed tomorrow so that patient can resume on hemodialysis. Most recent serum potassium was acceptable at 4.0. Patient remains anemic with hemoglobin 9.2 which we will continue  to monitor. Followup serum phosphorus as well as calcium tomorrow since both were previously elevated.      LOS: 0 Vicki Reyes 5/7/20184:09 PM

## 2016-07-12 ENCOUNTER — Encounter (HOSPITAL_COMMUNITY): Payer: Self-pay | Admitting: Interventional Radiology

## 2016-07-12 ENCOUNTER — Other Ambulatory Visit (HOSPITAL_COMMUNITY): Payer: Self-pay

## 2016-07-12 HISTORY — PX: IR FLUORO GUIDE CV LINE RIGHT: IMG2283

## 2016-07-12 HISTORY — PX: IR US GUIDE VASC ACCESS RIGHT: IMG2390

## 2016-07-12 LAB — RENAL FUNCTION PANEL
ANION GAP: 18 — AB (ref 5–15)
Albumin: 3.1 g/dL — ABNORMAL LOW (ref 3.5–5.0)
BUN: 148 mg/dL — ABNORMAL HIGH (ref 6–20)
CHLORIDE: 98 mmol/L — AB (ref 101–111)
CO2: 21 mmol/L — AB (ref 22–32)
Calcium: 9.8 mg/dL (ref 8.9–10.3)
Creatinine, Ser: 3.19 mg/dL — ABNORMAL HIGH (ref 0.44–1.00)
GFR calc non Af Amer: 15 mL/min — ABNORMAL LOW (ref >60)
GFR, EST AFRICAN AMERICAN: 17 mL/min — AB (ref >60)
GLUCOSE: 126 mg/dL — AB (ref 65–99)
POTASSIUM: 4 mmol/L (ref 3.5–5.1)
Phosphorus: 4 mg/dL (ref 2.5–4.6)
Sodium: 137 mmol/L (ref 135–145)

## 2016-07-12 LAB — CBC
HEMATOCRIT: 28.3 % — AB (ref 36.0–46.0)
HEMOGLOBIN: 8.9 g/dL — AB (ref 12.0–15.0)
MCH: 28.6 pg (ref 26.0–34.0)
MCHC: 31.4 g/dL (ref 30.0–36.0)
MCV: 91 fL (ref 78.0–100.0)
Platelets: 315 10*3/uL (ref 150–400)
RBC: 3.11 MIL/uL — AB (ref 3.87–5.11)
RDW: 15.6 % — ABNORMAL HIGH (ref 11.5–15.5)
WBC: 15.5 10*3/uL — ABNORMAL HIGH (ref 4.0–10.5)

## 2016-07-12 MED ORDER — HEPARIN SODIUM (PORCINE) 1000 UNIT/ML IJ SOLN
INTRAMUSCULAR | Status: AC
  Filled 2016-07-12: qty 1

## 2016-07-12 NOTE — Procedures (Signed)
Interventional Radiology Procedure Note  Procedure:  Right IJ temporary HD catheter.  20cm [place]  Complications: None  Recommendations:  - OK to use catheter - Do not submerge - May be converted if need be - Routine line care   Signed,  Yvone NeuJaime S. Loreta AveWagner, DO

## 2016-07-13 ENCOUNTER — Other Ambulatory Visit (HOSPITAL_COMMUNITY): Payer: Self-pay

## 2016-07-13 LAB — COMPREHENSIVE METABOLIC PANEL
ALBUMIN: 2.8 g/dL — AB (ref 3.5–5.0)
ALK PHOS: 126 U/L (ref 38–126)
ALT: 1041 U/L — AB (ref 14–54)
ANION GAP: 27 — AB (ref 5–15)
AST: 1550 U/L — AB (ref 15–41)
BILIRUBIN TOTAL: 0.7 mg/dL (ref 0.3–1.2)
BUN: 37 mg/dL — AB (ref 6–20)
CALCIUM: 8.1 mg/dL — AB (ref 8.9–10.3)
CO2: 19 mmol/L — AB (ref 22–32)
Chloride: 94 mmol/L — ABNORMAL LOW (ref 101–111)
Creatinine, Ser: 1.44 mg/dL — ABNORMAL HIGH (ref 0.44–1.00)
GFR calc Af Amer: 46 mL/min — ABNORMAL LOW (ref >60)
GFR calc non Af Amer: 39 mL/min — ABNORMAL LOW (ref >60)
GLUCOSE: 150 mg/dL — AB (ref 65–99)
Potassium: 4.7 mmol/L (ref 3.5–5.1)
SODIUM: 140 mmol/L (ref 135–145)
TOTAL PROTEIN: 7.7 g/dL (ref 6.5–8.1)

## 2016-07-13 LAB — PROTIME-INR
INR: 1.28
PROTHROMBIN TIME: 16.1 s — AB (ref 11.4–15.2)

## 2016-07-13 LAB — CBC
HEMATOCRIT: 29.9 % — AB (ref 36.0–46.0)
HEMOGLOBIN: 8.9 g/dL — AB (ref 12.0–15.0)
MCH: 28.4 pg (ref 26.0–34.0)
MCHC: 29.8 g/dL — ABNORMAL LOW (ref 30.0–36.0)
MCV: 95.5 fL (ref 78.0–100.0)
Platelets: 265 10*3/uL (ref 150–400)
RBC: 3.13 MIL/uL — ABNORMAL LOW (ref 3.87–5.11)
RDW: 15.8 % — ABNORMAL HIGH (ref 11.5–15.5)
WBC: 19.4 10*3/uL — AB (ref 4.0–10.5)

## 2016-07-13 LAB — PHOSPHORUS: Phosphorus: 7.8 mg/dL — ABNORMAL HIGH (ref 2.5–4.6)

## 2016-07-13 LAB — APTT: aPTT: 41 seconds — ABNORMAL HIGH (ref 24–36)

## 2016-07-13 LAB — MAGNESIUM: Magnesium: 2.6 mg/dL — ABNORMAL HIGH (ref 1.7–2.4)

## 2016-07-13 MED FILL — Medication: Qty: 1 | Status: AC

## 2016-08-05 NOTE — Progress Notes (Addendum)
I arrived at her room after ACLS was underway. Many of the nursing staff were already in the room, and Dr. Ronalee RedElmira Besaly was present on monitor. Charge RN summarized Ms. Corea's clinical course to me, including her anoxic brain injury, hemodialysis, chronic respiratory failure s/p tracheostomy, non-ischemic cardiomyopathy [EF<25%]. She remained in PEA until she was resuscitated.  After BP was cycled, and she was normotensive, I asked Dr. Sherrill RaringBesaly how else I could be of help to her. She acknowledged ordering labwork and imaging and asked if I think a central line was in order. I said likely it would but that we would need to be cautious given her dialysis access. She asked if I could put one in, and I told her that I was not authorized to do them independently. She requested one of the RN to contact PCCM and asked the charge RN to mix norepinephrine.  Before he could return, she went into PEA arrest a second time during which ACLS was reinitiated. Dr. Sherrill RaringBesaly made Vicki Reyes aware that she had another urgent RN call she needed to receive and stepped away from the monitor. After some time, she was again resuscitated.  Dr. Sherrill RaringBesaly authorized administration of bicarb and calcium chloride. When asked how fast she wanted norepinephrine ran, she was unable to give a clear answer.  She decompensated a third time, and when one of the nursing staff asked Dr. Sherrill RaringBesaly about bicarb, she explained that bicarb is not per ACLS protocol. One of the nursing staff was able to reach her husband by phone. I relayed the grave situation to her husband who agreed she should not suffer any further and was on his way back to the hospital. I was unaware after putting the phone down that she had regained a pulse but asked for a consensus in the room if resuming ACLS a fourth time would help Vicki Reyes reach meaningful neurologic recovery. Dr. Sherrill RaringBesaly replied that she did not have a good baseline to begin with and thus dismissed considerations or implications  of my conversation with her husband. She questioned whether the patient was truly in PEA arrest each time ACLS was initiated. I asked Dr. Sherrill RaringBesaly if I could be of any more assistance, and she said no and politely requested that I leave. I left the room just minutes before Dr. Nelson ChimesAmin signed her code note at 1:09AM.

## 2016-08-05 NOTE — Code Documentation (Signed)
CODE BLUE NOTE  Patient Name: Vicki Reyes   MRN: [id number]   Date of Birth/ Sex: 08/26/1960 , female      Admission Date: 06/26/2016  Attending Provider: Carron CurieHijazi, Ali, MD  Primary Diagnosis: <principal problem not specified>    Indication: Pt was in her usual state of health until this PM, when she was noted to be in PEA. Code blue was subsequently called. At the time of arrival on scene, ACLS protocol was underway.    Technical Description:  - CPR performance duration:  30 minutes  - Was defibrillation or cardioversion used? Yes   - Was external pacer placed? No  - Was patient intubated pre/post CPR? No    Medications Administered: Y = Yes; Blank = No Amiodarone    Atropine    Calcium    Epinephrine    Lidocaine    Magnesium    Norepinephrine    Phenylephrine    Sodium bicarbonate    Vasopressin      Post CPR evaluation:  - Final Status - Was patient successfully resuscitated ? Yes - What is current rhythm? NSR - What is current hemodynamic status? stable   Miscellaneous Information:  - Labs sent, including: Per primary.   - Primary team notified?  Yes  - Family Notified? Yes  - Additional notes/ transfer status: None        Freddrick MarchAmin, Quency Tober, MD  07/27/2016, 1:07 AM

## 2016-08-05 DEATH — deceased

## 2018-10-20 IMAGING — CR DG ABDOMEN 1V
1 series · 1 of 1 positions shown · non-contrast
Comparison: None available.

CLINICAL DATA: Initial evaluation for PEG tube placement.

EXAM:
ABDOMEN - 1 VIEW

[AP]
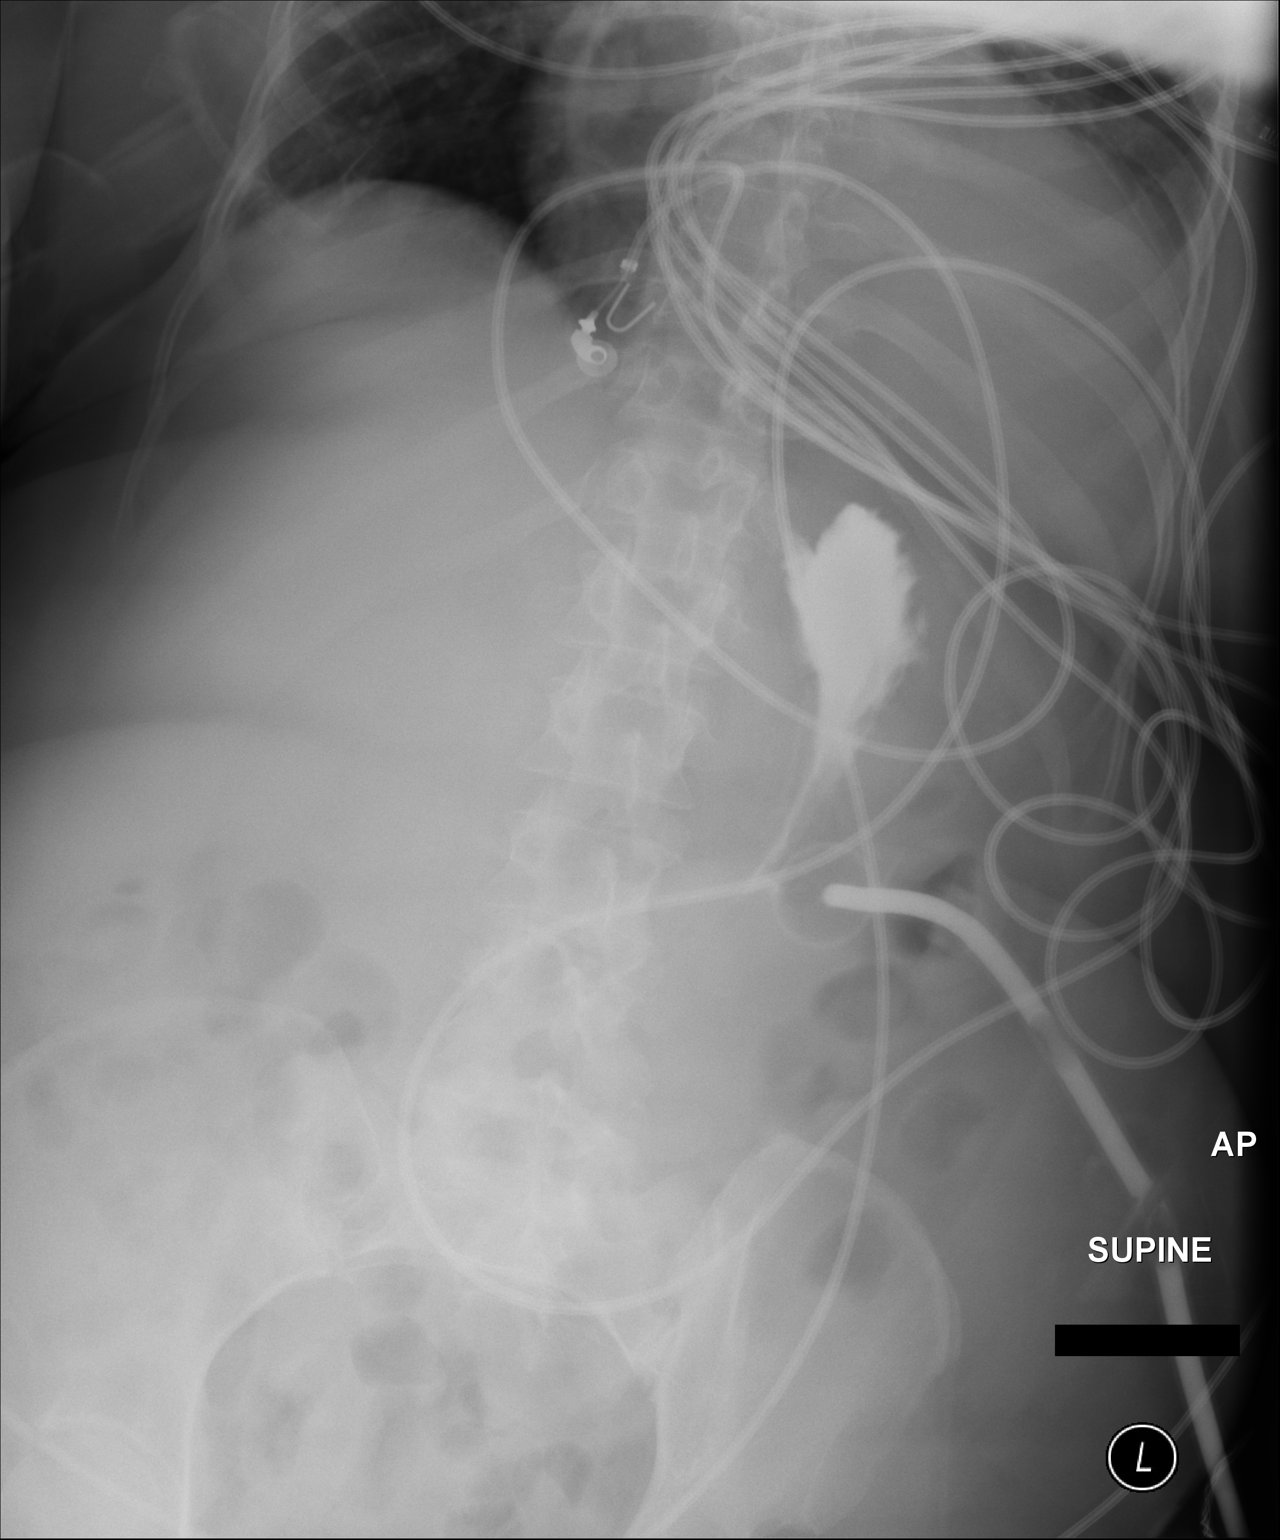

[1 of 1 positions shown; findings below may reference images not displayed]

FINDINGS: Percutaneous gastrostomy tube in place. Contrast material has been
injected via the tube and is seen filling the proximal stomach. Tube
appears appropriately positioned. No extraluminal leakage of
contrast identified.

Visualized bowel gas pattern within normal limits.
IMPRESSION: Percutaneous G-tube in appropriate position within the stomach. No
extraluminal contrast to suggest leak identified.

## 2018-10-21 IMAGING — DX DG CHEST 1V PORT
1 series · 1 of 1 positions shown · non-contrast
Comparison: None.

CLINICAL DATA: Respiratory failure

EXAM:
PORTABLE CHEST 1 VIEW

[chest ap]
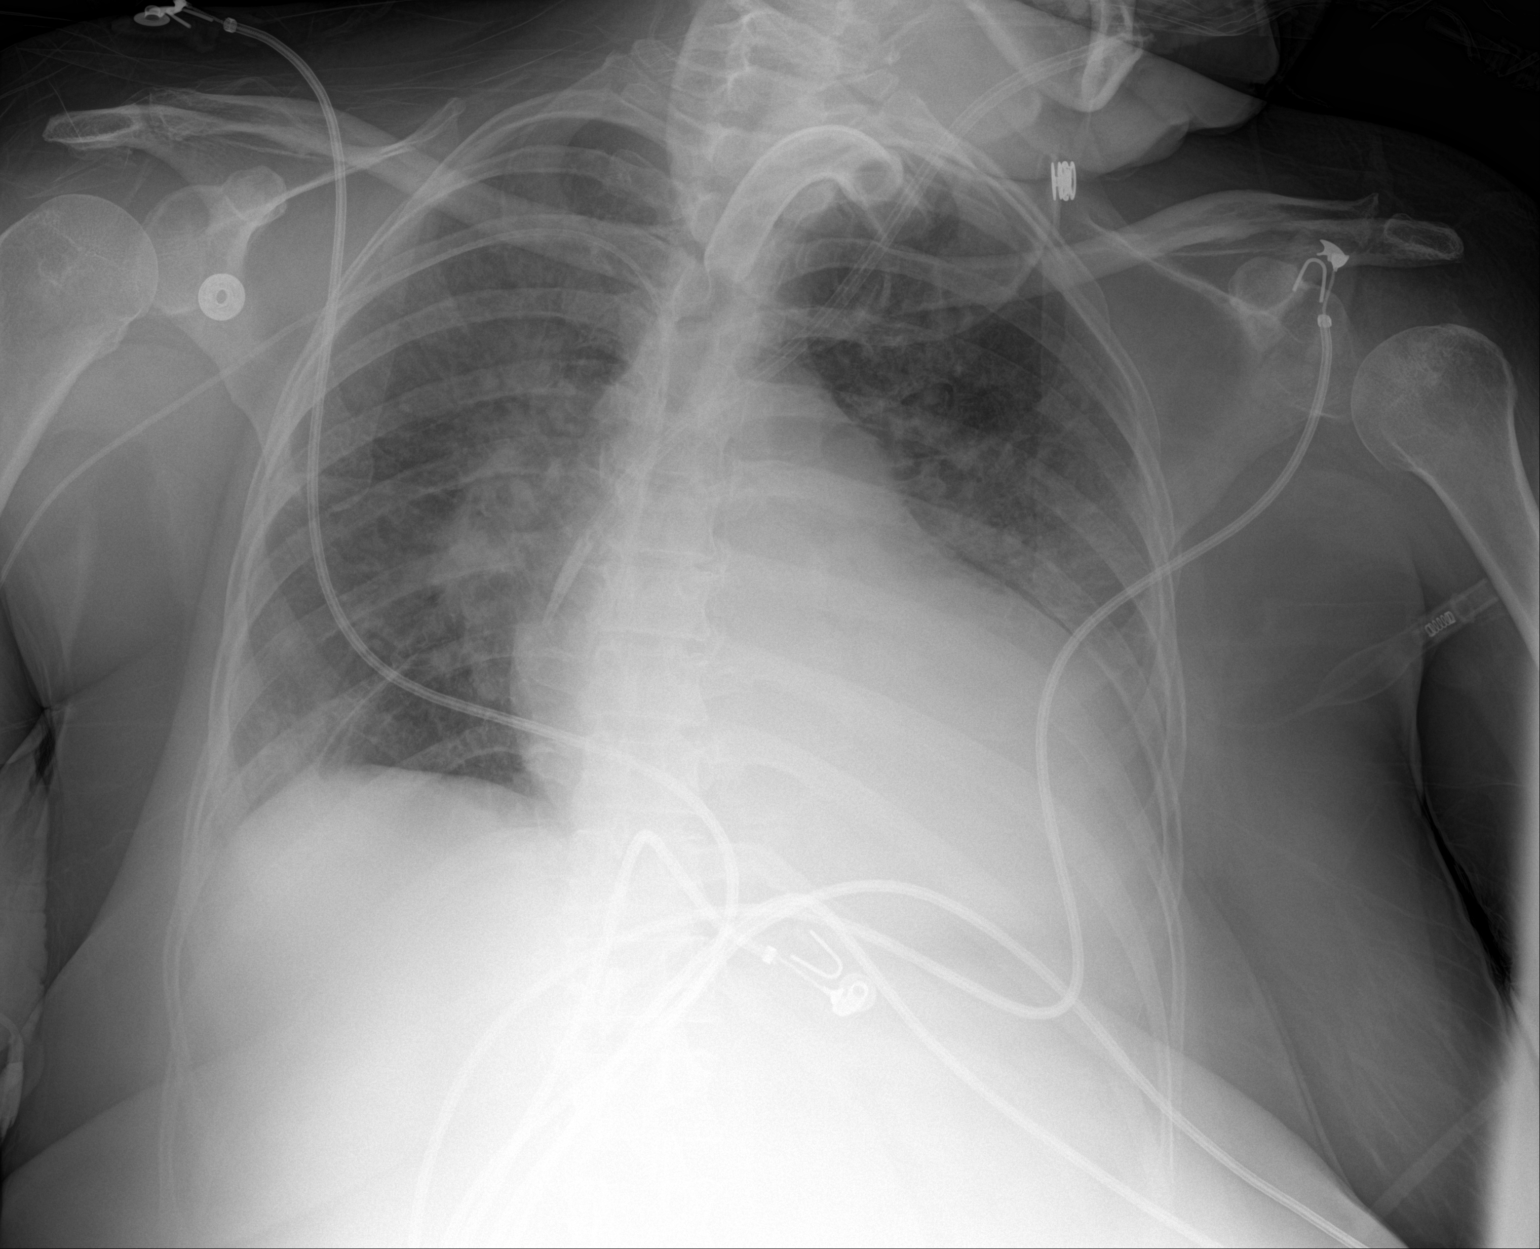

[1 of 1 positions shown; findings below may reference images not displayed]

FINDINGS: Tracheostomy in good position. Right arm PICC tip in the lower SVC.
Left jugular dual-lumen catheter in the lower SVC.

Cardiac enlargement with bilateral airspace disease most consistent
with edema. Left lower lobe consolidation. Small pleural effusions
bilaterally.
IMPRESSION: Bilateral airspace disease most consistent with pulmonary edema

Left lower lobe consolidation most likely collapse of the left lower
lobe.

## 2018-11-08 IMAGING — US IR US GUIDE VASC ACCESS RIGHT
1 series · 1 of 1 positions shown · non-contrast
Comparison: none

INDICATION: 55-year-old female with a history of renal failure.

[Series 1: ir rad eval and mgt. · 1 of 1 slices shown]
[im 1/1]
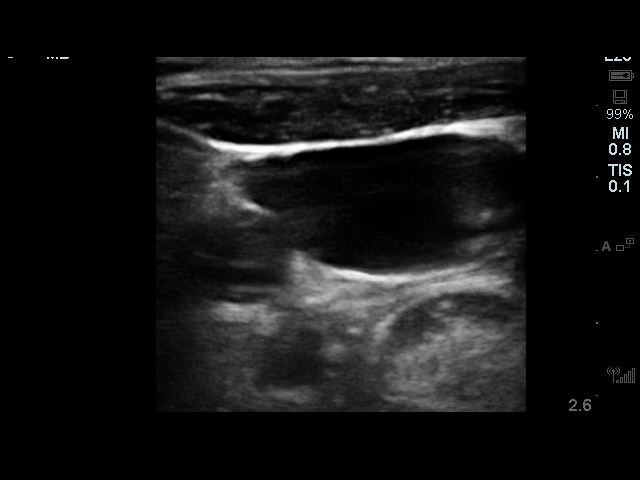

[1 of 1 positions shown; findings below may reference images not displayed]

EXAM:
TUNNELED CENTRAL VENOUS HEMODIALYSIS CATHETER PLACEMENT WITH
ULTRASOUND AND FLUOROSCOPIC GUIDANCE

MEDICATIONS:
None.

ANESTHESIA/SEDATION:
None

FLUOROSCOPY TIME:  Fluoroscopy Time: 0 minutes 6 seconds (0.2 mGy).

COMPLICATIONS:
None

PROCEDURE:
Informed written consent was obtained from the patient and the
patient's family after a thorough discussion of the procedural
risks, benefits and alternatives. All questions were addressed. A
timeout was performed prior to the initiation of the procedure.

The right neck and chest was prepped with chlorhexidine, and draped
in the usual sterile fashion using maximum barrier technique (cap
and mask, sterile gown, sterile gloves, large sterile sheet, hand
hygiene and cutaneous antiseptic). Local anesthesia was attained by
infiltration with 1% lidocaine without epinephrine.

Ultrasound demonstrated patency of the right internal jugular vein,
and this was documented with an image. Under real-time ultrasound
guidance, this vein was accessed with a 21 gauge micropuncture
needle and image documentation was performed. A small dermatotomy
was made at the access site with an 11 scalpel. A 0.018" wire was
advanced into the SVC and the access needle exchanged for a 4F
micropuncture vascular sheath. The 0.018" wire was then removed and
a 0.035" wire advanced into the IVC. Upon withdrawal of the 018
wire, the wire was marked for appropriate length of the internal
portion of the catheter.

A 20 cm catheter was selected. Skin and subcutaneous tissues were
serially dilated. Catheter was placed on the wire.

The catheter tip is positioned in the upper right atrium. This was
documented with a spot image. Both ports of the hemodialysis
catheter were then tested for excellent function. The ports were
then locked with heparinized lock.

Patient tolerated the procedure well and remained hemodynamically
stable throughout.

No complications were encountered and no significant blood loss was
encountered.
IMPRESSION: Status post placement of temporary HD catheter in the right IJ.
Catheter ready for use.
